# Patient Record
Sex: Female | Born: 1955 | Race: White | Hispanic: No | Smoking: Current every day smoker
Health system: Southern US, Community
[De-identification: ages and names within clinical notes are randomized; demographics above are authoritative.]

## PROBLEM LIST (undated history)

## (undated) DIAGNOSIS — F419 Anxiety disorder, unspecified: Secondary | ICD-10-CM

## (undated) DIAGNOSIS — K219 Gastro-esophageal reflux disease without esophagitis: Secondary | ICD-10-CM

## (undated) DIAGNOSIS — F29 Unspecified psychosis not due to a substance or known physiological condition: Secondary | ICD-10-CM

## (undated) DIAGNOSIS — F32A Depression, unspecified: Secondary | ICD-10-CM

## (undated) DIAGNOSIS — F329 Major depressive disorder, single episode, unspecified: Secondary | ICD-10-CM

---

## 2007-02-04 ENCOUNTER — Ambulatory Visit: Payer: Self-pay | Admitting: Internal Medicine

## 2007-02-18 ENCOUNTER — Ambulatory Visit: Payer: Self-pay | Admitting: Internal Medicine

## 2007-03-05 ENCOUNTER — Ambulatory Visit: Payer: Self-pay | Admitting: Internal Medicine

## 2007-03-05 LAB — CONVERTED CEMR LAB
Basophils Absolute: 0 10*3/uL (ref 0.0–0.1)
Basophils Relative: 0 % (ref 0–1)
Cholesterol: 205 mg/dL — ABNORMAL HIGH (ref 0–200)
Eosinophils Absolute: 0.1 10*3/uL — ABNORMAL LOW (ref 0.2–0.7)
Free T4: 1.08 ng/dL (ref 0.89–1.80)
HCT: 42.3 % (ref 36.0–46.0)
HDL: 49 mg/dL (ref >39)
Hemoglobin: 14 g/dL (ref 12.0–15.0)
Lymphocytes Relative: 35 % (ref 12–46)
Monocytes Absolute: 0.7 10*3/uL (ref 0.1–1.0)
Neutrophils Relative %: 55 % (ref 43–77)
Platelets: 347 10*3/uL (ref 150–400)
RBC: 4.41 M/uL (ref 3.87–5.11)
TSH: 1.478 microintl units/mL (ref 0.350–5.50)

## 2007-03-17 ENCOUNTER — Ambulatory Visit: Payer: Self-pay | Admitting: Internal Medicine

## 2007-03-24 ENCOUNTER — Ambulatory Visit: Payer: Self-pay | Admitting: Internal Medicine

## 2007-05-13 ENCOUNTER — Ambulatory Visit: Payer: Self-pay | Admitting: Internal Medicine

## 2007-05-28 ENCOUNTER — Ambulatory Visit: Payer: Self-pay | Admitting: Internal Medicine

## 2007-07-02 ENCOUNTER — Ambulatory Visit: Payer: Self-pay | Admitting: Internal Medicine

## 2007-07-09 ENCOUNTER — Emergency Department (HOSPITAL_COMMUNITY): Admission: EM | Admit: 2007-07-09 | Discharge: 2007-07-09 | Payer: Self-pay | Admitting: Emergency Medicine

## 2007-09-03 ENCOUNTER — Ambulatory Visit: Payer: Self-pay | Admitting: *Deleted

## 2007-10-31 ENCOUNTER — Emergency Department (HOSPITAL_COMMUNITY): Admission: EM | Admit: 2007-10-31 | Discharge: 2007-10-31 | Payer: Self-pay | Admitting: Emergency Medicine

## 2007-12-25 ENCOUNTER — Ambulatory Visit: Payer: Self-pay | Admitting: Internal Medicine

## 2008-03-04 ENCOUNTER — Ambulatory Visit: Payer: Self-pay | Admitting: Internal Medicine

## 2008-03-04 LAB — CONVERTED CEMR LAB
ALT: 13 units/L (ref 0–35)
Alkaline Phosphatase: 87 units/L (ref 39–117)
BUN: 25 mg/dL — ABNORMAL HIGH (ref 6–23)
Barbiturate Quant, Ur: NEGATIVE
Benzodiazepines.: NEGATIVE
Calcium: 10.2 mg/dL (ref 8.4–10.5)
Chloride: 103 meq/L (ref 96–112)
Creatinine, Ser: 0.75 mg/dL (ref 0.40–1.20)
Creatinine,U: 64.3 mg/dL
HCT: 38.9 % (ref 36.0–46.0)
Hemoglobin: 12.9 g/dL (ref 12.0–15.0)
Lymphocytes Relative: 25 % (ref 12–46)
Lymphs Abs: 3.1 10*3/uL (ref 0.7–4.0)
MCHC: 33.2 g/dL (ref 30.0–36.0)
MCV: 94.9 fL (ref 78.0–100.0)
Marijuana Metabolite: NEGATIVE
Neutro Abs: 8.2 10*3/uL — ABNORMAL HIGH (ref 1.7–7.7)
Opiate Screen, Urine: NEGATIVE
Phencyclidine (PCP): NEGATIVE
Platelets: 314 10*3/uL (ref 150–400)
Potassium: 4.1 meq/L (ref 3.5–5.3)
Propoxyphene: NEGATIVE
Sodium: 139 meq/L (ref 135–145)
Total Bilirubin: 0.2 mg/dL — ABNORMAL LOW (ref 0.3–1.2)

## 2008-03-10 ENCOUNTER — Ambulatory Visit (HOSPITAL_COMMUNITY): Admission: RE | Admit: 2008-03-10 | Discharge: 2008-03-10 | Payer: Self-pay | Admitting: Internal Medicine

## 2008-04-02 ENCOUNTER — Emergency Department (HOSPITAL_COMMUNITY): Admission: EM | Admit: 2008-04-02 | Discharge: 2008-04-02 | Payer: Self-pay | Admitting: Emergency Medicine

## 2008-04-06 ENCOUNTER — Ambulatory Visit: Payer: Self-pay | Admitting: Internal Medicine

## 2008-04-09 ENCOUNTER — Emergency Department (HOSPITAL_COMMUNITY): Admission: EM | Admit: 2008-04-09 | Discharge: 2008-04-09 | Payer: Self-pay | Admitting: Family Medicine

## 2008-06-11 ENCOUNTER — Emergency Department (HOSPITAL_COMMUNITY): Admission: EM | Admit: 2008-06-11 | Discharge: 2008-06-11 | Payer: Self-pay | Admitting: Emergency Medicine

## 2009-06-22 ENCOUNTER — Inpatient Hospital Stay (HOSPITAL_COMMUNITY): Admission: EM | Admit: 2009-06-22 | Discharge: 2009-06-30 | Payer: Self-pay | Admitting: Emergency Medicine

## 2009-06-25 ENCOUNTER — Ambulatory Visit: Payer: Self-pay | Admitting: Psychiatry

## 2009-07-25 ENCOUNTER — Encounter (HOSPITAL_BASED_OUTPATIENT_CLINIC_OR_DEPARTMENT_OTHER): Admission: RE | Admit: 2009-07-25 | Discharge: 2009-10-23 | Payer: Self-pay | Admitting: General Surgery

## 2009-08-11 IMAGING — CR DG CHEST 2V
2 series · 2 of 2 positions shown · non-contrast
Comparison: 07/09/2007

CLINICAL DATA: Cough, congestion, posterior chest pain, shortness
of breath, smoker

CHEST - 2 VIEW

[w chest lat]
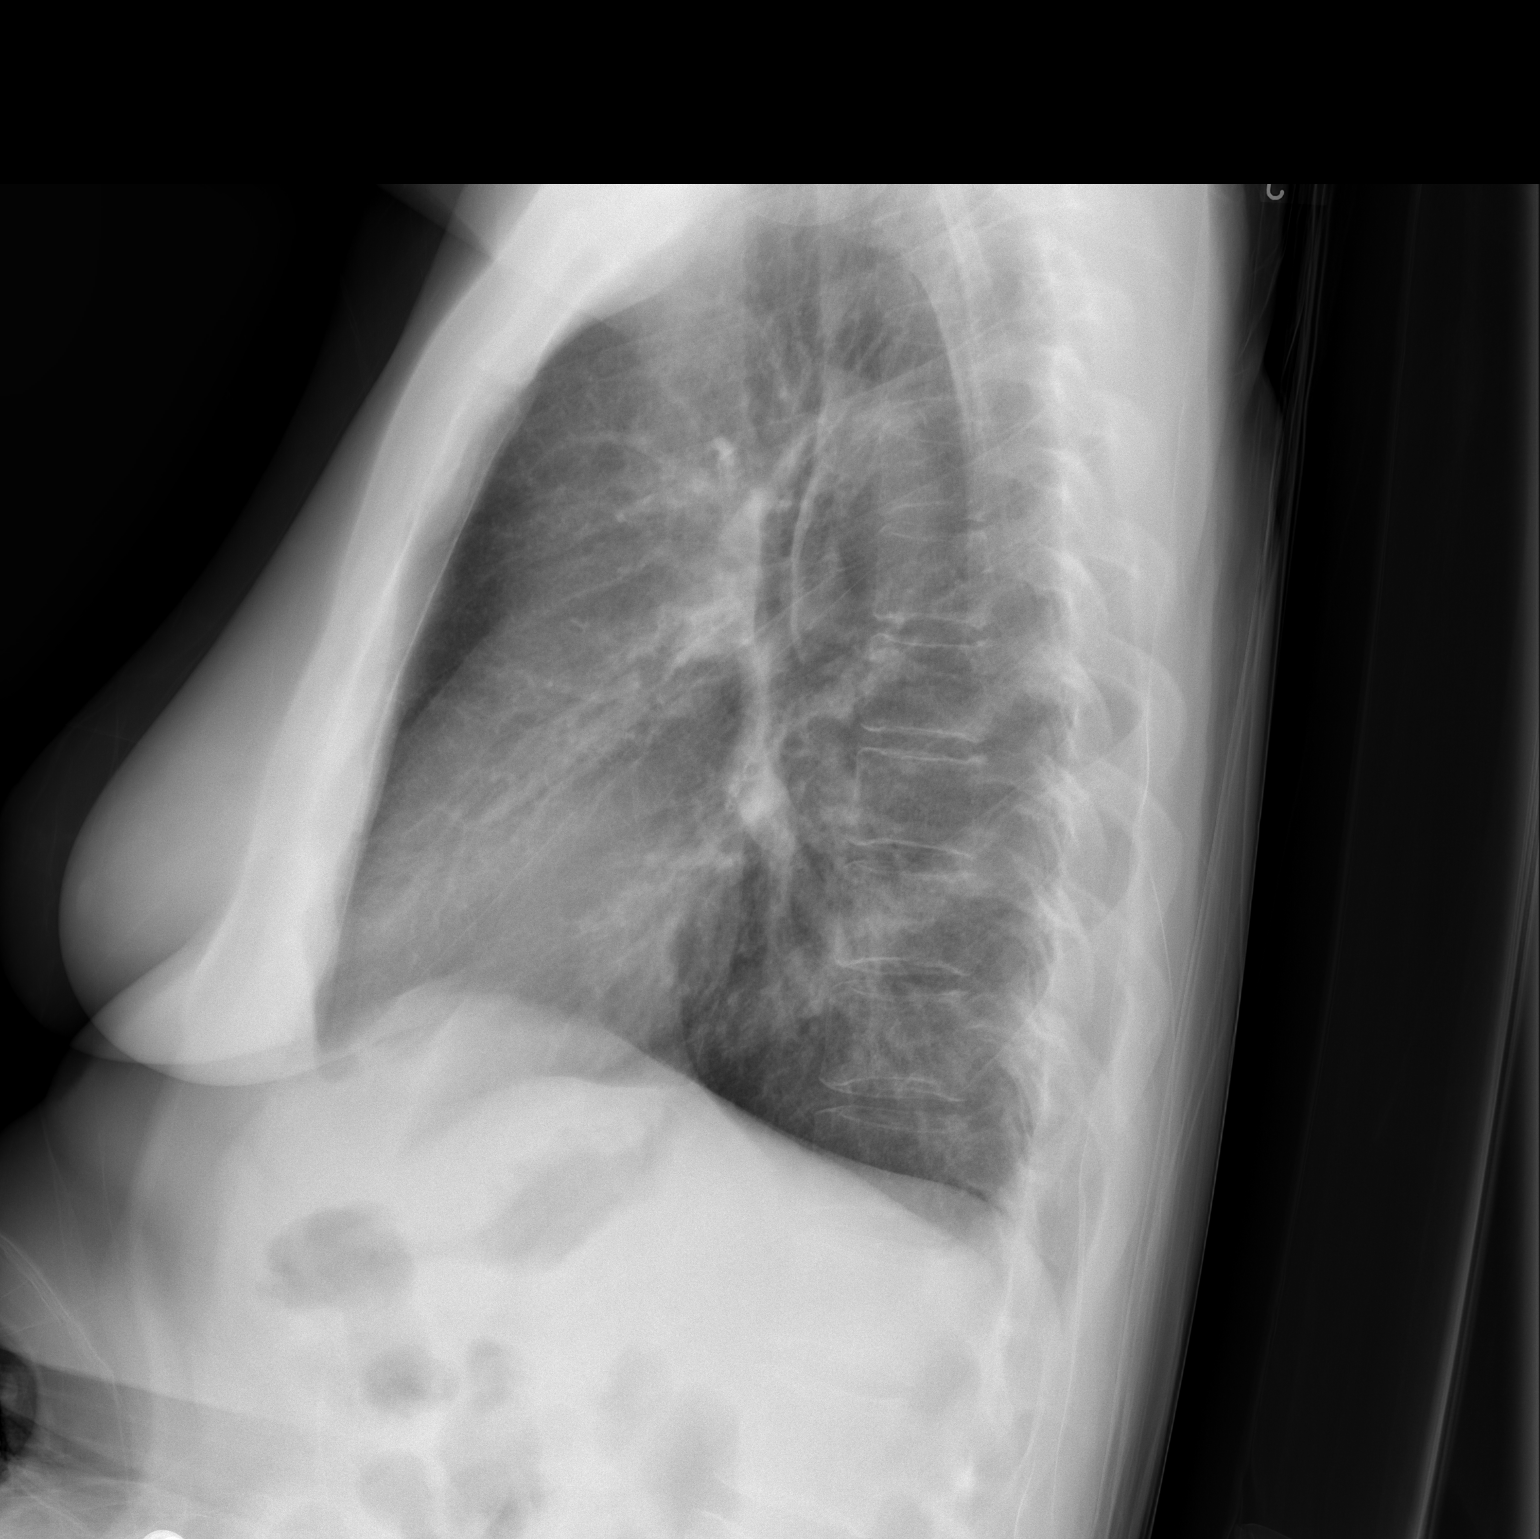

[w chest pa]
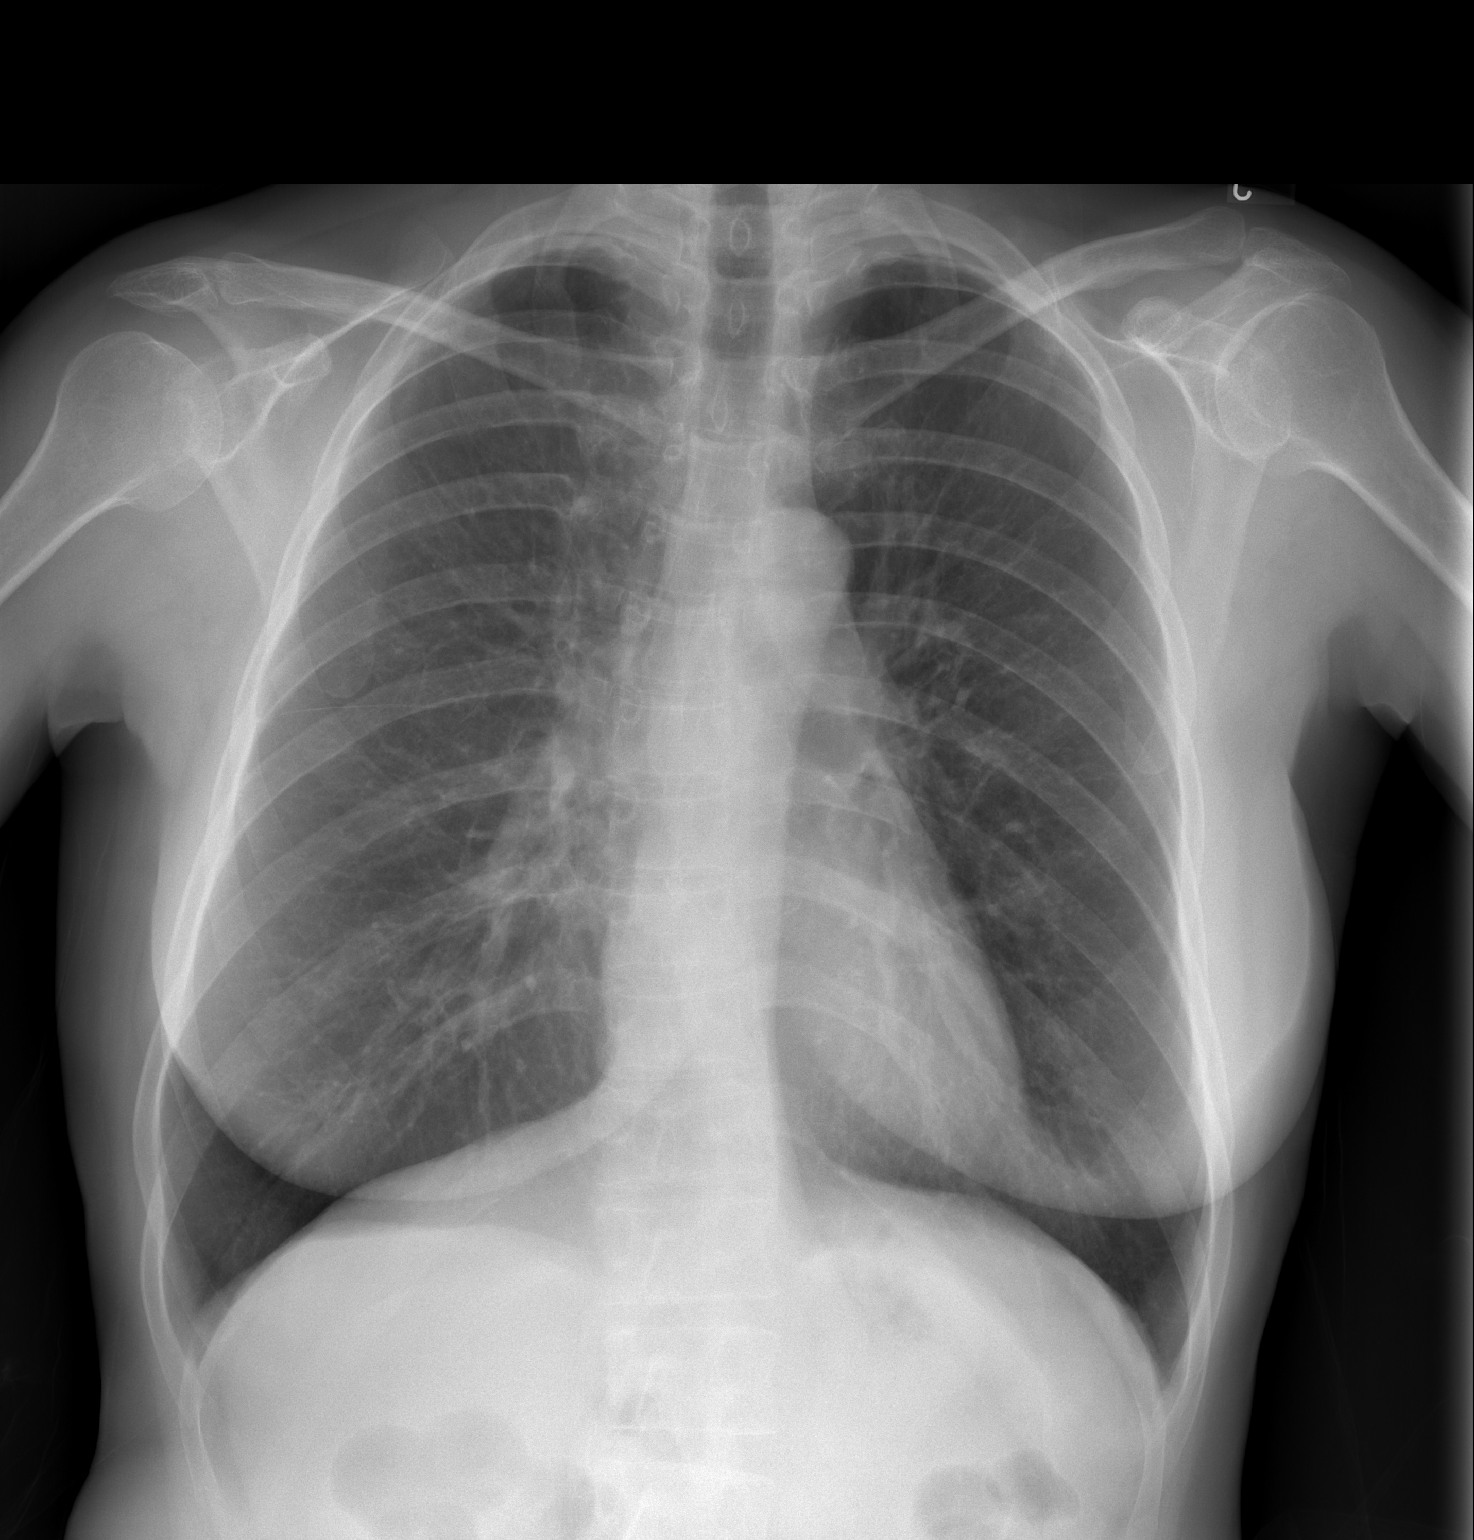

[2 of 2 positions shown; findings below may reference images not displayed]

FINDINGS: Normal heart size, mediastinal contours, and pulmonary vascularity.
Minimal peribronchial thickening and pulmonary hyperexpansion.
No pulmonary infiltrate, pleural effusion, or pneumothorax.
Question mild bony demineralization.
IMPRESSION: Minimal chronic peribronchial thickening and hyperinflation,
question asthma and COPD.
No acute abnormalities.

## 2010-06-07 LAB — TYPE AND SCREEN
ABO/RH(D): O POS
Antibody Screen: NEGATIVE

## 2010-06-07 LAB — DIFFERENTIAL
Basophils Absolute: 0.1 10*3/uL (ref 0.0–0.1)
Basophils Relative: 1 % (ref 0–1)
Eosinophils Absolute: 0 10*3/uL (ref 0.0–0.7)
Eosinophils Absolute: 0 10*3/uL (ref 0.0–0.7)
Eosinophils Relative: 1 % (ref 0–5)
Lymphocytes Relative: 30 % (ref 12–46)
Lymphs Abs: 1.4 10*3/uL (ref 0.7–4.0)
Lymphs Abs: 1.6 10*3/uL (ref 0.7–4.0)
Monocytes Absolute: 0.6 10*3/uL (ref 0.1–1.0)
Monocytes Relative: 11 % (ref 3–12)
Neutrophils Relative %: 57 % (ref 43–77)

## 2010-06-07 LAB — CBC
HCT: 20.9 % — ABNORMAL LOW (ref 36.0–46.0)
HCT: 23.6 % — ABNORMAL LOW (ref 36.0–46.0)
HCT: 25 % — ABNORMAL LOW (ref 36.0–46.0)
Hemoglobin: 7 g/dL — ABNORMAL LOW (ref 12.0–15.0)
Hemoglobin: 8 g/dL — ABNORMAL LOW (ref 12.0–15.0)
Hemoglobin: 8.4 g/dL — ABNORMAL LOW (ref 12.0–15.0)
Hemoglobin: 8.6 g/dL — ABNORMAL LOW (ref 12.0–15.0)
Hemoglobin: 9.9 g/dL — ABNORMAL LOW (ref 12.0–15.0)
MCHC: 33.3 g/dL (ref 30.0–36.0)
MCHC: 33.9 g/dL (ref 30.0–36.0)
MCV: 95.6 fL (ref 78.0–100.0)
MCV: 96.2 fL (ref 78.0–100.0)
MCV: 96.4 fL (ref 78.0–100.0)
MCV: 96.8 fL (ref 78.0–100.0)
MCV: 97.2 fL (ref 78.0–100.0)
Platelets: 407 10*3/uL — ABNORMAL HIGH (ref 150–400)
Platelets: 447 10*3/uL — ABNORMAL HIGH (ref 150–400)
RBC: 2.16 MIL/uL — ABNORMAL LOW (ref 3.87–5.11)
RBC: 2.38 MIL/uL — ABNORMAL LOW (ref 3.87–5.11)
RBC: 3.06 MIL/uL — ABNORMAL LOW (ref 3.87–5.11)
RDW: 14.6 % (ref 11.5–15.5)
RDW: 15.1 % (ref 11.5–15.5)
RDW: 15.8 % — ABNORMAL HIGH (ref 11.5–15.5)
WBC: 4.4 10*3/uL (ref 4.0–10.5)
WBC: 6.3 10*3/uL (ref 4.0–10.5)

## 2010-06-07 LAB — URINALYSIS, ROUTINE W REFLEX MICROSCOPIC
Glucose, UA: NEGATIVE mg/dL
Hgb urine dipstick: NEGATIVE
Protein, ur: NEGATIVE mg/dL
pH: 7 (ref 5.0–8.0)

## 2010-06-07 LAB — POCT I-STAT, CHEM 8
Calcium, Ion: 1.02 mmol/L — ABNORMAL LOW (ref 1.12–1.32)
HCT: 25 % — ABNORMAL LOW (ref 36.0–46.0)
TCO2: 23 mmol/L (ref 0–100)

## 2010-06-07 LAB — CULTURE, BLOOD (ROUTINE X 2): Culture: NO GROWTH

## 2010-06-07 LAB — RETICULOCYTES
RBC.: 2.53 MIL/uL — ABNORMAL LOW (ref 3.87–5.11)
Retic Count, Absolute: 83.5 10*3/uL (ref 19.0–186.0)

## 2010-06-07 LAB — COMPREHENSIVE METABOLIC PANEL
ALT: 17 U/L (ref 0–35)
AST: 22 U/L (ref 0–37)
AST: 30 U/L (ref 0–37)
Albumin: 2.3 g/dL — ABNORMAL LOW (ref 3.5–5.2)
BUN: 6 mg/dL (ref 6–23)
Calcium: 8 mg/dL — ABNORMAL LOW (ref 8.4–10.5)
Calcium: 8 mg/dL — ABNORMAL LOW (ref 8.4–10.5)
Chloride: 105 mEq/L (ref 96–112)
Creatinine, Ser: 0.72 mg/dL (ref 0.4–1.2)
Creatinine, Ser: 0.84 mg/dL (ref 0.4–1.2)
GFR calc Af Amer: >60 mL/min (ref >60)
Glucose, Bld: 126 mg/dL — ABNORMAL HIGH (ref 70–99)
Sodium: 140 mEq/L (ref 135–145)
Total Protein: 5.4 g/dL — ABNORMAL LOW (ref 6.0–8.3)
Total Protein: 6.3 g/dL (ref 6.0–8.3)

## 2010-06-07 LAB — ACETAMINOPHEN LEVEL: Acetaminophen (Tylenol), Serum: <10 ug/mL — ABNORMAL LOW (ref 10–30)

## 2010-06-07 LAB — OVA AND PARASITE EXAMINATION
Ova and parasites: NONE SEEN
Ova and parasites: NONE SEEN

## 2010-06-07 LAB — BASIC METABOLIC PANEL
BUN: 8 mg/dL (ref 6–23)
CO2: 25 mEq/L (ref 19–32)
Chloride: 104 mEq/L (ref 96–112)
GFR calc non Af Amer: >60 mL/min (ref >60)
Glucose, Bld: 114 mg/dL — ABNORMAL HIGH (ref 70–99)
Potassium: 4 mEq/L (ref 3.5–5.1)
Sodium: 137 mEq/L (ref 135–145)

## 2010-06-07 LAB — PHOSPHORUS: Phosphorus: 2.5 mg/dL (ref 2.3–4.6)

## 2010-06-07 LAB — FERRITIN: Ferritin: 42 ng/mL (ref 10–291)

## 2010-06-07 LAB — PROTIME-INR
INR: 1.18 (ref 0.00–1.49)
Prothrombin Time: 14.9 seconds (ref 11.6–15.2)
Prothrombin Time: 15.2 seconds (ref 11.6–15.2)

## 2010-06-07 LAB — TSH: TSH: 1.505 u[IU]/mL (ref 0.350–4.500)

## 2010-06-07 LAB — IRON AND TIBC: UIBC: 206 ug/dL

## 2010-06-07 LAB — RAPID URINE DRUG SCREEN, HOSP PERFORMED
Barbiturates: NOT DETECTED
Benzodiazepines: POSITIVE — AB

## 2010-06-07 LAB — SALICYLATE LEVEL: Salicylate Lvl: 4.2 mg/dL (ref 2.8–20.0)

## 2010-06-07 LAB — FOLATE: Folate: 19.6 ng/mL

## 2010-12-15 LAB — DIFFERENTIAL
Eosinophils Absolute: 0.2
Lymphocytes Relative: 42
Lymphs Abs: 2.7
Monocytes Relative: 8
Neutro Abs: 3
Neutrophils Relative %: 47

## 2010-12-15 LAB — URINALYSIS, ROUTINE W REFLEX MICROSCOPIC
Bilirubin Urine: NEGATIVE
Hgb urine dipstick: NEGATIVE
Ketones, ur: NEGATIVE
Nitrite: NEGATIVE
Specific Gravity, Urine: 1.021
Urobilinogen, UA: 0.2
pH: 6

## 2010-12-15 LAB — CBC
Hemoglobin: 13.5
MCHC: 33.9
MCV: 96
RBC: 4.16
RDW: 13.6

## 2010-12-15 LAB — GC/CHLAMYDIA PROBE AMP, GENITAL: GC Probe Amp, Genital: NEGATIVE

## 2010-12-15 LAB — COMPREHENSIVE METABOLIC PANEL
BUN: 14
CO2: 25
Calcium: 10.1
Creatinine, Ser: 0.75
GFR calc non Af Amer: >60
Glucose, Bld: 111 — ABNORMAL HIGH
Sodium: 138
Total Protein: 6.7

## 2010-12-15 LAB — WET PREP, GENITAL
Clue Cells Wet Prep HPF POC: NONE SEEN
Trich, Wet Prep: NONE SEEN
WBC, Wet Prep HPF POC: NONE SEEN
Yeast Wet Prep HPF POC: NONE SEEN

## 2010-12-15 LAB — LIPASE, BLOOD: Lipase: 19

## 2016-03-14 ENCOUNTER — Encounter (HOSPITAL_COMMUNITY): Payer: Self-pay | Admitting: Emergency Medicine

## 2016-03-14 ENCOUNTER — Emergency Department (HOSPITAL_COMMUNITY)
Admission: EM | Admit: 2016-03-14 | Discharge: 2016-03-15 | Disposition: A | Discharge status: Other Acute Inpatient Hospital | Payer: Self-pay | Attending: Emergency Medicine | Admitting: Emergency Medicine

## 2016-03-14 ENCOUNTER — Emergency Department (HOSPITAL_COMMUNITY): Payer: Self-pay

## 2016-03-14 DIAGNOSIS — Z5181 Encounter for therapeutic drug level monitoring: Secondary | ICD-10-CM | POA: Insufficient documentation

## 2016-03-14 DIAGNOSIS — F29 Unspecified psychosis not due to a substance or known physiological condition: Secondary | ICD-10-CM | POA: Insufficient documentation

## 2016-03-14 DIAGNOSIS — F17210 Nicotine dependence, cigarettes, uncomplicated: Secondary | ICD-10-CM | POA: Insufficient documentation

## 2016-03-14 HISTORY — DX: Unspecified psychosis not due to a substance or known physiological condition: F29

## 2016-03-14 HISTORY — DX: Depression, unspecified: F32.A

## 2016-03-14 HISTORY — DX: Major depressive disorder, single episode, unspecified: F32.9

## 2016-03-14 HISTORY — DX: Anxiety disorder, unspecified: F41.9

## 2016-03-14 HISTORY — DX: Gastro-esophageal reflux disease without esophagitis: K21.9

## 2016-03-14 LAB — CBC WITH DIFFERENTIAL/PLATELET
Basophils Absolute: 0 10*3/uL (ref 0.0–0.1)
Basophils Relative: 0 %
EOS ABS: 0.1 10*3/uL (ref 0.0–0.7)
Eosinophils Relative: 1 %
HEMATOCRIT: 44 % (ref 36.0–46.0)
HEMOGLOBIN: 15 g/dL (ref 12.0–15.0)
LYMPHS ABS: 1.7 10*3/uL (ref 0.7–4.0)
LYMPHS PCT: 15 %
MCH: 32.4 pg (ref 26.0–34.0)
MCHC: 34.1 g/dL (ref 30.0–36.0)
MCV: 95 fL (ref 78.0–100.0)
MONOS PCT: 6 %
Monocytes Absolute: 0.7 10*3/uL (ref 0.1–1.0)
NEUTROS ABS: 8.5 10*3/uL — AB (ref 1.7–7.7)
NEUTROS PCT: 78 %
Platelets: 327 10*3/uL (ref 150–400)
RBC: 4.63 MIL/uL (ref 3.87–5.11)
RDW: 12 % (ref 11.5–15.5)
WBC: 10.9 10*3/uL — ABNORMAL HIGH (ref 4.0–10.5)

## 2016-03-14 LAB — COMPREHENSIVE METABOLIC PANEL
ALBUMIN: 4.4 g/dL (ref 3.5–5.0)
ALK PHOS: 101 U/L (ref 38–126)
ALT: 13 U/L — ABNORMAL LOW (ref 14–54)
ANION GAP: 8 (ref 5–15)
AST: 22 U/L (ref 15–41)
BUN: 7 mg/dL (ref 6–20)
CALCIUM: 9 mg/dL (ref 8.9–10.3)
CHLORIDE: 101 mmol/L (ref 101–111)
CO2: 23 mmol/L (ref 22–32)
Creatinine, Ser: 0.68 mg/dL (ref 0.44–1.00)
GFR calc Af Amer: >60 mL/min (ref >60)
GFR calc non Af Amer: >60 mL/min (ref >60)
GLUCOSE: 125 mg/dL — AB (ref 65–99)
Potassium: 3.5 mmol/L (ref 3.5–5.1)
SODIUM: 132 mmol/L — AB (ref 135–145)
Total Bilirubin: 0.8 mg/dL (ref 0.3–1.2)
Total Protein: 7.7 g/dL (ref 6.5–8.1)

## 2016-03-14 LAB — RAPID URINE DRUG SCREEN, HOSP PERFORMED
AMPHETAMINES: NOT DETECTED
BARBITURATES: NOT DETECTED
Benzodiazepines: NOT DETECTED
Cocaine: NOT DETECTED
Opiates: NOT DETECTED
TETRAHYDROCANNABINOL: POSITIVE — AB

## 2016-03-14 LAB — URINALYSIS, ROUTINE W REFLEX MICROSCOPIC
BILIRUBIN URINE: NEGATIVE
Bacteria, UA: NONE SEEN
Glucose, UA: NEGATIVE mg/dL
Ketones, ur: NEGATIVE mg/dL
LEUKOCYTES UA: NEGATIVE
NITRITE: NEGATIVE
PH: 5 (ref 5.0–8.0)
Protein, ur: NEGATIVE mg/dL
SPECIFIC GRAVITY, URINE: 1.012 (ref 1.005–1.030)

## 2016-03-14 LAB — ETHANOL: Alcohol, Ethyl (B): <5 mg/dL (ref <5)

## 2016-03-14 MED ORDER — ALUM & MAG HYDROXIDE-SIMETH 200-200-20 MG/5ML PO SUSP: 30.0000 mL | ORAL | Status: DC | PRN

## 2016-03-14 MED ORDER — ACETAMINOPHEN 325 MG PO TABS: 650.0000 mg | ORAL_TABLET | ORAL | Status: DC | PRN

## 2016-03-14 MED ORDER — NICOTINE 21 MG/24HR TD PT24
21.0000 mg | MEDICATED_PATCH | Freq: Every day | TRANSDERMAL | Status: DC
  Administered 2016-03-14: 21 mg via TRANSDERMAL
  Filled 2016-03-14 (×2): qty 1
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
## 2016-03-14 NOTE — ED Notes (Signed)
Pt allowed to use phone at this time  
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
## 2016-03-14 NOTE — ED Notes (Signed)
Pt given copy of IVC paperwork. Pt says she has a court case pending on Friday the 29th that she must be at or she will lose her home. Pt asked if she informed [facility] of the same & she stated she did.

## 2016-03-14 NOTE — [facility] Counselor (Signed)
Spoke with Ron, RN who reports the pt is going to have a CT scan in order to rule out any medical concerns [facility]. Bunnie PionAC Tori, RN reviewing.   Princess BruinsAquicha Duff, MSW, Theresia MajorsLCSWA
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
## 2016-03-14 NOTE — BH Assessment (Signed)
Bed assignment [facility] been [facility] Tori, RN and [facility] TTS to seek placement. Ron, RN has been contacted and a copy of the IVC paperwork has been requested in order to seek placement for the pt.   Princess BruinsAquicha Duff, MSW, Theresia MajorsLCSWA

## 2016-03-14 NOTE — ED Notes (Signed)
Pt requested books to read. Agricultural engineerBrenda(safety sitter) was able to locate some books for her. Pt remains calm and cooperative.

## 2016-03-14 NOTE — ED Notes (Signed)
Pt ambulated to restroom. Pt requested crackers & peanut butter. Provided.  Returned to room w/ sitter present.

## 2016-03-14 NOTE — Clinical Social Work Note (Signed)
Pt has been declined by the following facilities:  [facility], [facility], [facility]. Inpt admission is still being researched.   Princess BruinsAquicha Duff, MSW, Theresia MajorsLCSWA

## 2016-03-14 NOTE — Progress Notes (Signed)
Per Jacki ConesLaurie, NP meets inpatient criteria. Pt. [facility] pending medical clearance. Patient will need to have neurological looked at . Patient has no hx of psych treatment.  Orry Sigl K. Sherlon HandingHarris, LCAS-A, LPC-A, [facility]  Counselor 03/14/2016 6:25 PM

## 2016-03-14 NOTE — ED Notes (Signed)
IVC paperwork faxed to [facility] 

## 2016-03-14 NOTE — ED Notes (Signed)
Pt given a coke to drink.

## 2016-03-14 NOTE — ED Notes (Signed)
Pt refusing CT scan stating its "bull shit". Pt asking to see MD. Dr Richrd PrimeMcMannus made aware of the pt wanting to see her. EDP went to room to explain reason for testing. Pt again told MD that she thougt the test was "bull shit".

## 2016-03-14 NOTE — BH Assessment (Signed)
Pt has been referred to the following inpt facilities: [facility], [facility], [facility], [facility], [facility] [address] [place] [facility] [address] [facility], [facility], [place], [place] [facility], [place], [place], [place]  Princess BruinsAquicha Duff, MSW, [employer]CSWA

## 2016-03-15 ENCOUNTER — Encounter (HOSPITAL_COMMUNITY): Payer: Self-pay | Admitting: *Deleted

## 2016-03-15 ENCOUNTER — Inpatient Hospital Stay (HOSPITAL_COMMUNITY)
Admission: AD | Admit: 2016-03-15 | Discharge: 2016-03-17 | DRG: 885 | Disposition: A | Discharge status: Home / Self Care | Payer: No Typology Code available for payment source | Attending: Psychiatry | Admitting: Psychiatry

## 2016-03-15 DIAGNOSIS — F17210 Nicotine dependence, cigarettes, uncomplicated: Secondary | ICD-10-CM | POA: Diagnosis present

## 2016-03-15 DIAGNOSIS — F22 Delusional disorders: Principal | ICD-10-CM | POA: Diagnosis present

## 2016-03-15 DIAGNOSIS — F29 Unspecified psychosis not due to a substance or known physiological condition: Secondary | ICD-10-CM | POA: Diagnosis present

## 2016-03-15 DIAGNOSIS — Z79899 Other long term (current) drug therapy: Secondary | ICD-10-CM | POA: Diagnosis not present

## 2016-03-15 MED ORDER — OLANZAPINE 5 MG PO TBDP
5.0000 mg | ORAL_TABLET | Freq: Once | ORAL | Status: DC
  Filled 2016-03-15: qty 1

## 2016-03-15 MED ORDER — OLANZAPINE 10 MG PO TBDP: 10.0000 mg | ORAL_TABLET | Freq: Three times a day (TID) | ORAL | Status: DC | PRN

## 2016-03-15 MED ORDER — OLANZAPINE 5 MG PO TBDP
5.0000 mg | ORAL_TABLET | Freq: Three times a day (TID) | ORAL | Status: DC
  Filled 2016-03-15 (×5): qty 1

## 2016-03-15 MED ORDER — HYDROXYZINE HCL 25 MG PO TABS
25.0000 mg | ORAL_TABLET | Freq: Four times a day (QID) | ORAL | Status: DC | PRN
  Filled 2016-03-15: qty 10

## 2016-03-15 MED ORDER — TRAZODONE HCL 50 MG PO TABS
50.0000 mg | ORAL_TABLET | Freq: Every evening | ORAL | Status: DC | PRN
  Filled 2016-03-15: qty 7

## 2016-03-15 MED ORDER — ALUM & MAG HYDROXIDE-SIMETH 200-200-20 MG/5ML PO SUSP
30.0000 mL | ORAL | Status: DC | PRN
  Administered 2016-03-16 (×3): 30 mL via ORAL
  Filled 2016-03-15 (×3): qty 30

## 2016-03-15 MED ORDER — MAGNESIUM HYDROXIDE 400 MG/5ML PO SUSP: 30.0000 mL | Freq: Every day | ORAL | Status: DC | PRN

## 2016-03-15 MED ORDER — NICOTINE 21 MG/24HR TD PT24
21.0000 mg | MEDICATED_PATCH | Freq: Every day | TRANSDERMAL | Status: DC
  Filled 2016-03-15 (×3): qty 1

## 2016-03-15 MED ORDER — ZIPRASIDONE MESYLATE 20 MG IM SOLR: 20.0000 mg | INTRAMUSCULAR | Status: DC | PRN

## 2016-03-15 MED ORDER — NICOTINE POLACRILEX 2 MG MT GUM
2.0000 mg | CHEWING_GUM | OROMUCOSAL | Status: DC | PRN
  Administered 2016-03-16 – 2016-03-17 (×3): 2 mg via ORAL
  Filled 2016-03-15 (×3): qty 1

## 2016-03-15 MED ORDER — ACETAMINOPHEN 325 MG PO TABS: 650.0000 mg | ORAL_TABLET | Freq: Four times a day (QID) | ORAL | Status: DC | PRN

## 2016-03-15 MED ORDER — OLANZAPINE 10 MG IM SOLR: 10.0000 mg | Freq: Once | INTRAMUSCULAR | Status: DC | PRN

## 2016-03-15 MED ORDER — LORAZEPAM 1 MG PO TABS: 1.0000 mg | ORAL_TABLET | ORAL | Status: DC | PRN

## 2016-03-15 NOTE — ED Notes (Signed)
[facility] notified that pt is en route to facility via sheriff dept.

## 2016-03-15 NOTE — ED Notes (Signed)
Pt ambulated to bathroom 

## 2016-03-15 NOTE — ED Notes (Signed)
Pt given meal.   Pt has copy of IVC paper in room given by night shift nurse and is fixated that there is no "file number" on her paperwork and that the paperwork is void because of it.  Pt explained that she is still Involuntarily committed and cannot leave.   Pt is also saying that she has an eviction court date tomorrow and is worried because she will not be able to make the appearance.  Pt wanting proof that she will be exempt from attending due to her involuntary commitment. She is also worried because her cats are outside and unable to get into the house.   MD notified of pt concerns.

## 2016-03-15 NOTE — ED Notes (Addendum)
Pt refusing EKG. Dr. Adriana Simasook notified.  Claire [name], [facility] notified and says that we need EKG.  Pt explained that it was for a new medication.  PT states that she does not want any new medication.

## 2016-03-15 NOTE — ED Notes (Addendum)
Pt given meal tray.  Pt also requesting to see phones to get phone number to neighbors house so they can take care of her cats who are locked outside.   Security at bedside with belongings from safe and pt, with observation of nurse and 2 security guards, opened up phones to retrieve phone numbers. While looking through phone I notified pt that she could only have 2, 5 min phone calls per day, she then told me "take a few steps back and shut the fuck up."  Security and nurse both explained to pt that she would not speak to staff in that manner.  Pt soon apologized.   Belongings have been re-inventoried by security and placed back into safe.   Pt now taking a shower with supervision of security and female sitter.  Pt given new gown, new socks, toothbrush/paste, soap, washcloths, and towels.

## 2016-03-15 NOTE — ED Notes (Signed)
Meal given to pt.

## 2016-03-15 NOTE — ED Notes (Signed)
Pt requested phone number for [place] [facility].  Pt given number and is on phone at this time.

## 2016-03-15 NOTE — ED Notes (Signed)
Security at bedside delivering valuables to [employer]uty sheriff.

## 2016-03-15 NOTE — ED Notes (Addendum)
Pt called charge RN to room to discuss the fact she is supposed to appeal an eviction notice tomorrow and is concerned because she is under IVC and is being [facility].  Aliene Al[place]Pat Simpson [place] said that if patient can't get to the court's office tomorrow, she will need to contact an attorney.  If pt or her attorney signs the paperwork, a friend or family member can take it to the court on her behalf.  Notified pt.
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
## 2016-03-15 NOTE — Tx Team (Signed)
Initial Treatment Plan 03/15/2016 10:14 PM Claire [name] [id number]    PATIENT STRESSORS: Legal issue Loss of cats Traumatic event   PATIENT STRENGTHS: Ability for insight Average or above average intelligence Capable of independent living Communication skills Motivation for treatment/growth Physical Health   PATIENT IDENTIFIED PROBLEMS: Paranoia  "Save my house"  "Save my cat family"                 DISCHARGE CRITERIA:  Ability to meet basic life and health needs Improved stabilization in mood, thinking, and/or behavior Motivation to continue treatment in a less acute level of care Need for constant or close observation no longer present  PRELIMINARY DISCHARGE PLAN: Return to previous living arrangement  PATIENT/FAMILY INVOLVEMENT: This treatment plan has been presented to and reviewed with the patient, Claire [name].  The patient and family have been given the opportunity to ask questions and make suggestions.  Carleene OverlieMiddleton, Kenn Rekowski P, RN 03/15/2016, 10:14 PM

## 2016-03-15 NOTE — BHH Counselor (Signed)
[facility] [place]. Pt will be going to room 505-1.

## 2016-03-15 NOTE — ED Notes (Signed)
Pt requesting vegetarian meal for lunch, Diplomatic Services operational officersecretary notified.

## 2016-03-15 NOTE — ED Notes (Signed)
MidwifeDeputy sheriff given packet of pt information to give to [facility] including: EMTALA, facesheet, carelink transfer, med necessity, copies of IVC paperwork, e-signature.    As patient left, she stated "Claire [name], you're a brilliant snot".  Pt walked out with Midwifedeputy sheriff. Alert and oriented, in no acute distress.
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
## 2016-03-15 NOTE — ED Notes (Signed)
Pt ambulated to restroom & returned to room w/ no complications. 
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
## 2016-03-16 ENCOUNTER — Encounter (HOSPITAL_COMMUNITY): Payer: Self-pay | Admitting: Psychiatry

## 2016-03-16 DIAGNOSIS — Z79899 Other long term (current) drug therapy: Secondary | ICD-10-CM

## 2016-03-16 DIAGNOSIS — F22 Delusional disorders: Principal | ICD-10-CM

## 2016-03-16 NOTE — Progress Notes (Signed)
Social worker assisting patient to retrieve document from email account. All interdiscplinary team members are working together to assist patient with retaining documents and notarizing, so she can avoid being evicted from home.

## 2016-03-16 NOTE — Progress Notes (Signed)
Patient A/O, no note distress. Patient was encourage to wait until Tuesday to complete court paperwork. She agreed. Urine specimen collected. Throughout the shift patient has been cooperative, she participated in groups. Patient no issues with sleeping. She denies feeling depressed. Staff will continue to assist patient and maintain safety.

## 2016-03-16 NOTE — BHH Group Notes (Signed)
BHH LCSW Group Therapy  03/16/2016 1:46 PM  Type of Therapy:  Group Therapy  Participation Level:  Did Not Attend  Modes of Intervention:  Activity, Discussion, Education, Socialization and Support  Summary of Progress/Problems: Chaplin facilitated group. Patients discussed care and what care means to them.   [name] [name] MSW, LCSWA  03/16/2016, 1:46 PM  

## 2016-03-16 NOTE — BHH Counselor (Signed)
Pt is currently refusing aftercare.   Daisy FloroCandace L Jatavion Peaster MSW, LCSWA  03/16/2016 4:46 PM

## 2016-03-16 NOTE — Progress Notes (Signed)
Notified AC of patient needing documentation notarize, she noted "I am ready to notarize documents when family bring them to facility."
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
## 2016-03-16 NOTE — Progress Notes (Signed)
Writer has observed patient up in the dayroom with minimal interaction with peers. She reports to Clinical research associatewriter during our 1:1 conversation that she was glad that her paperwork was taken care of so that she won't be evicted out of her apartment. 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 She attended group this evening and reports that since being here the staff have been very helpful. Support and encouragement given, safety maintained on unit with 15 min checks.

## 2016-03-16 NOTE — Progress Notes (Signed)
Patient with family member filling out documents.

## 2016-03-16 NOTE — Tx Team (Signed)
Interdisciplinary Treatment and Diagnostic Plan Update  03/16/2016 Time of Session: 9:00am Claire [name] Delia MRN: [id number]  Principal Diagnosis: Delusional disorder, persecutory type [name](HCC)  Secondary Diagnoses: Principal Problem:   Delusional disorder, persecutory type (HCC)   Current Medications:  Current Facility-Administered Medications  Medication Dose Route Frequency Provider Last Rate Last Dose  . acetaminophen (TYLENOL) tablet 650 mg  650 mg Oral Q6H PRN Laveda AbbeLaurie Britton Parks, NP      . alum & mag hydroxide-simeth (MAALOX/MYLANTA) 200-200-20 MG/5ML suspension 30 mL  30 mL Oral Q4H PRN Laveda AbbeLaurie Britton Parks, NP   30 mL at 03/16/16 0504  . hydrOXYzine (ATARAX/VISTARIL) tablet 25 mg  25 mg Oral Q6H PRN Laveda AbbeLaurie Britton Parks, NP      . magnesium hydroxide (MILK OF MAGNESIA) suspension 30 mL  30 mL Oral Daily PRN Laveda AbbeLaurie Britton Parks, NP      . nicotine (NICODERM CQ - dosed in mg/24 hours) patch 21 mg  21 mg Transdermal Daily Laveda AbbeLaurie Britton Parks, NP      . nicotine polacrilex (NICORETTE) gum 2 mg  2 mg Oral PRN Jackelyn PolingJason A Berry, NP      . traZODone (DESYREL) tablet 50 mg  50 mg Oral QHS PRN Laveda AbbeLaurie Britton Parks, NP       PTA Medications: Prescriptions Prior to Admission  Medication Sig Dispense Refill Last Dose  . aspirin 325 MG tablet Take 650-975 mg by mouth every 4 (four) hours as needed for mild pain or moderate pain.   Past Week at Unknown time    Patient Stressors: Legal issue Loss of cats Traumatic event  Patient Strengths: Ability for insight Average or above average intelligence Capable of independent living Communication skills Motivation for treatment/growth Physical Health  Treatment Modalities: Medication Management, Group therapy, Case management,  1 to 1 session with clinician, Psychoeducation, Recreational therapy.   Physician Treatment Plan for Primary Diagnosis: Delusional disorder, persecutory type (HCC) Long Term Goal(s): Improvement in symptoms so as  ready for discharge Improvement in symptoms so as ready for discharge   Short Term Goals: Ability to identify changes in lifestyle to reduce recurrence of condition will improve Ability to verbalize feelings will improve Ability to demonstrate self-control will improve Ability to identify changes in lifestyle to reduce recurrence of condition will improve Ability to verbalize feelings will improve Ability to maintain clinical measurements within normal limits will improve  Medication Management: Evaluate patient's response, side effects, and tolerance of medication regimen.  Therapeutic Interventions: 1 to 1 sessions, Unit Group sessions and Medication administration.  Evaluation of Outcomes: Progressing  Physician Treatment Plan for Secondary Diagnosis: Principal Problem:   Delusional disorder, persecutory type (HCC)  Long Term Goal(s): Improvement in symptoms so as ready for discharge Improvement in symptoms so as ready for discharge   Short Term Goals: Ability to identify changes in lifestyle to reduce recurrence of condition will improve Ability to verbalize feelings will improve Ability to demonstrate self-control will improve Ability to identify changes in lifestyle to reduce recurrence of condition will improve Ability to verbalize feelings will improve Ability to maintain clinical measurements within normal limits will improve     Medication Management: Evaluate patient's response, side effects, and tolerance of medication regimen.  Therapeutic Interventions: 1 to 1 sessions, Unit Group sessions and Medication administration.  Evaluation of Outcomes: Progressing   RN Treatment Plan for Primary Diagnosis: Delusional disorder, persecutory type (HCC) Long Term Goal(s): Knowledge of disease and therapeutic regimen to maintain health will improve  Short Term Goals: Ability  to verbalize frustration and anger appropriately will improve, Ability to demonstrate self-control,  Ability to participate in decision making will improve, Ability to identify and develop effective coping behaviors will improve and Compliance with prescribed medications will improve  Medication Management: RN will administer medications as ordered by provider, will assess and evaluate patient's response and provide education to patient for prescribed medication. RN will report any adverse and/or side effects to prescribing provider.  Therapeutic Interventions: 1 on 1 counseling sessions, Psychoeducation, Medication administration, Evaluate responses to treatment, Monitor vital signs and CBGs as ordered, Perform/monitor CIWA, COWS, AIMS and Fall Risk screenings as ordered, Perform wound care treatments as ordered.  Evaluation of Outcomes: Progressing   LCSW Treatment Plan for Primary Diagnosis: Delusional disorder, persecutory type (HCC) Long Term Goal(s): Safe transition to appropriate next level of care at discharge, Engage patient in therapeutic group addressing interpersonal concerns.  Short Term Goals: Engage patient in aftercare planning with referrals and resources, Increase social support, Increase ability to appropriately verbalize feelings, Increase emotional regulation and Increase skills for wellness and recovery  Therapeutic Interventions: Assess for all discharge needs, 1 to 1 time with Social worker, Explore available resources and support systems, Assess for adequacy in community support network, Educate family and significant other(s) on suicide prevention, Complete Psychosocial Assessment, Interpersonal group therapy.  Evaluation of Outcomes: Progressing   Progress in Treatment: Attending groups: Yes. Participating in groups: Yes. Taking medication as prescribed: No Toleration medication: No. and As evidenced by:  Pt not taking meds.  Family/Significant other contact made: No, will contact:  pt refused  Patient understands diagnosis: No. and As evidenced by:  Limited  insight  Discussing patient identified problems/goals with staff: Yes. Medical problems stabilized or resolved: Yes. Denies suicidal/homicidal ideation: Yes. Issues/concerns per patient self-inventory: No. Other: NA  New problem(s) identified: No, Describe:  NA  New Short Term/Long Term Goal(s):  Discharge Plan or Barriers: Pt plans to return home and follow up with outpatient.    Reason for Continuation of Hospitalization: Delusions  Medication stabilization   Estimated Length of Stay: 3-5 days   Attendees: Patient: 03/16/2016 1:32 PM  Physician: Jomarie LongsSaramma Eappen, MD  03/16/2016 1:32 PM  Nursing: Jari SportsmanBeulah, RN  03/16/2016 1:32 PM  RN Care Manager: Victorino DikeJennifer, RN  03/16/2016 1:32 PM  Social Worker: Rondall Allegraandace L Amadu Schlageter, LCSWA 03/16/2016 1:32 PM  Recreational Therapist:  03/16/2016 1:32 PM  Other:  03/16/2016 1:32 PM  Other:  03/16/2016 1:32 PM  Other: 03/16/2016 1:32 PM    Scribe for Treatment Team: Rondall Allegraandace L Al Bracewell, LCSWA 03/16/2016 1:32 PM

## 2016-03-16 NOTE — Progress Notes (Signed)
Adult Psychoeducational Group Note  Date:  03/16/2016 Time:  8:36 PM  Group Topic/Focus:  Wrap-Up Group:   The focus of this group is to help patients review their daily goal of treatment and discuss progress on daily workbooks.   Participation Level:  Active  Participation Quality:  Appropriate  Affect:  Appropriate  Cognitive:  Appropriate  Insight: Appropriate  Engagement in Group:  Engaged  Modes of Intervention:  Discussion  Additional Comments: The patient expressed that she attended groups.The patient also said that she rates today a 10. Octavio Mannshigpen, Braidon Chermak Lee 03/16/2016, 8:36 PM

## 2016-03-16 NOTE — Progress Notes (Signed)
Recreation Therapy Notes  Date: 03/16/16 Time: 1000 Location: [address] Dayroom  Group Topic: Coping Skills  Goal Area(s) Addresses:  Patient will be able to identify positive coping skills. Patient will be able to identify benefits of coping skills. Patient will be able to identify benefits of using coping skills post d/c.  Intervention: Can with various coping skills in it, dry erase maker, dry erase board  Activity: Coping Skills Pictionary.  LRT had [name] can with various types of coping skills.  [name] patient would come to the board, pick [name] slip of paper from the can and draw whatever is on the paper on the board.  The remaining patients would attempt to guess what the patient is drawing.  The person that guesses correctly would go next.  Education: Coping Skills, Discharge Planning.   Education Outcome: Acknowledges understanding/In group clarification offered/Needs additional education.   Clinical Observations/Feedback: Pt did not attend group.   Neysa Arts, LRT/CTRS         Claire [name] 03/16/2016 1:10 PM 
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
## 2016-03-17 LAB — COMPREHENSIVE METABOLIC PANEL
ALT: 14 U/L (ref 14–54)
AST: 22 U/L (ref 15–41)
Albumin: 4.1 g/dL (ref 3.5–5.0)
Alkaline Phosphatase: 87 U/L (ref 38–126)
Anion gap: 8 (ref 5–15)
BILIRUBIN TOTAL: 0.9 mg/dL (ref 0.3–1.2)
BUN: 9 mg/dL (ref 6–20)
CHLORIDE: 103 mmol/L (ref 101–111)
CO2: 26 mmol/L (ref 22–32)
CREATININE: 0.52 mg/dL (ref 0.44–1.00)
Calcium: 9.1 mg/dL (ref 8.9–10.3)
Glucose, Bld: 119 mg/dL — ABNORMAL HIGH (ref 65–99)
Potassium: 4.3 mmol/L (ref 3.5–5.1)
Sodium: 137 mmol/L (ref 135–145)
TOTAL PROTEIN: 6.8 g/dL (ref 6.5–8.1)

## 2016-03-17 LAB — URINALYSIS, COMPLETE (UACMP) WITH MICROSCOPIC
BACTERIA UA: NONE SEEN
BILIRUBIN URINE: NEGATIVE
Glucose, UA: NEGATIVE mg/dL
HGB URINE DIPSTICK: NEGATIVE
KETONES UR: NEGATIVE mg/dL
LEUKOCYTES UA: NEGATIVE
NITRITE: NEGATIVE
Protein, ur: NEGATIVE mg/dL
Specific Gravity, Urine: 1.006 (ref 1.005–1.030)
pH: 7 (ref 5.0–8.0)

## 2016-03-17 LAB — VITAMIN B12: VITAMIN B 12: 194 pg/mL (ref 180–914)

## 2016-03-17 LAB — RPR: RPR: NONREACTIVE

## 2016-03-17 LAB — FOLATE: FOLATE: 36.5 ng/mL (ref >5.9)

## 2016-03-17 MED ORDER — LOPERAMIDE HCL 2 MG PO CAPS
4.0000 mg | ORAL_CAPSULE | Freq: Once | ORAL | Status: AC
  Administered 2016-03-17: 4 mg via ORAL
  Filled 2016-03-17: qty 2

## 2016-03-17 MED ORDER — LOPERAMIDE HCL 2 MG PO CAPS
ORAL_CAPSULE | ORAL | Status: AC
  Administered 2016-03-17: 4 mg via ORAL
  Filled 2016-03-17: qty 2

## 2016-03-17 MED ORDER — NICOTINE POLACRILEX 2 MG MT GUM: 2.0000 mg | CHEWING_GUM | OROMUCOSAL | 0 refills | Status: AC | PRN

## 2016-03-17 MED ORDER — TRAZODONE HCL 50 MG PO TABS: 50.0000 mg | ORAL_TABLET | Freq: Every evening | ORAL | 0 refills | Status: AC | PRN

## 2016-03-17 MED ORDER — HYDROXYZINE HCL 25 MG PO TABS: 25.0000 mg | ORAL_TABLET | Freq: Four times a day (QID) | ORAL | 0 refills | Status: AC | PRN

## 2016-03-17 NOTE — [facility] Suicide Risk Assessment (Signed)
[facility]   Principal Problem: Delusional disorder, persecutory type (HCC) ( resolved )  Discharge Diagnoses:  Patient Active Problem List   Diagnosis Date Noted  . Delusional disorder, persecutory type (HCC) [F22] 03/16/2016    Total Time spent with patient: 30 minutes  Musculoskeletal: Strength & Muscle Tone: within normal limits Gait & Station: normal Patient leans: N/A  Psychiatric Specialty Exam: Review of Systems  Psychiatric/Behavioral: Negative for depression and suicidal ideas.  All other systems reviewed and are negative.   Blood pressure (!) 97/53, pulse 77, temperature 97.8 F (36.6 C), temperature source Oral, resp. rate 16, height 5\' 2"  (1.575 m), weight 50.3 kg (111 lb).Body mass index is 20.3 kg/m.  General Appearance: Casual  Eye Contact::  Fair  Speech:  Clear and Coherent409  Volume:  Normal  Mood:  Euthymic  Affect:  Congruent  Thought Process:  Goal Directed and Descriptions of Associations: Intact  Orientation:  Full (Time, Place, and Person)  Thought Content:  Logical  Suicidal Thoughts:  No  Homicidal Thoughts:  No  Memory:  Immediate;   Fair Recent;   Fair Remote;   Fair  Judgement:  Fair  Insight:  Fair  Psychomotor Activity:  Normal  Concentration:  Fair  Recall:  [employer]  Fund of Knowledge:Fair  Language: Fair  Akathisia:  No  Handed:  Right  AIMS (if indicated):     Assets:  Desire for Improvement  Sleep:  Number of Hours: 5.25  Cognition: WNL  ADL's:  Intact   Mental Status Per Nursing Assessment::   On Admission:  NA  Demographic Factors:  Caucasian  Loss Factors: NA  Historical Factors: Impulsivity  Risk Reduction Factors:   Positive therapeutic relationship  Continued Clinical Symptoms:  none  Cognitive Features That Contribute To Risk:  None    Suicide Risk:  Minimal: No identifiable suicidal ideation.  Patients presenting with no risk factors but with morbid ruminations; may be  classified as minimal risk based on the severity of the depressive symptoms    Plan Of Care/Follow-up recommendations:  Activity:  no restrictions Other:  none  Claire Fenter, MD 03/17/2016, 8:53 AM

## 2016-03-17 NOTE — BHH Group Notes (Signed)
BHH Group Notes:  (Nursing/MHT/Case Management/Adjunct)  Date:  03/17/2016  Time:  10:17 AM  Type of Therapy:  Nurse Education  Participation Level:  active  Participation Quality: engaged  Affect:  animated  Cognitive:  Appropriate  Insight:  Limited  Engagement in Group:  active  Modes of Intervention:  Discussion  Summary of Progress/Problems: Patient goals were getting released and going home and taking care of her family. Identified her "church tree" as a coping skill that works well for her - communing with nature.  Vinetta BergamoBarbara M Lamonica Trueba 03/17/2016, 10:17 AM

## 2016-03-17 NOTE — Progress Notes (Signed)
Patient to discharged ambulatory and stable to lobby. All discharge paperwork given and signed, valuables returned. Prescriptions given. Patient able to verbalize understanding. Patient stable, denies SI/HI/AVH. Patient given opportunity to express concerns and ask questions.  

## 2016-03-17 NOTE — Progress Notes (Signed)
  [facility] :  Will you be returning to the same living situation after discharge:  Yes,  alone At discharge, do you have transportation home?: Yes,  cousin Do you have the ability to pay for your medications: No.  Despite having SSDI, states she has no insurance  Release of information consent forms completed and in the chart;  Patient's signature needed at discharge.  Patient to Follow up at: Follow-up Information    Aftercare plans are refused by patient Follow up.           Next level of care provider has [facility] to [facility] Link:no  Safety Planning and Suicide Prevention discussed: No.  Have you used any form of tobacco in the last 30 days? (Cigarettes, Smokeless Tobacco, Cigars, and/or Pipes): Yes  Has patient been referred to the Quitline?: Patient refused referral  Patient has been referred for addiction treatment: Pt. refused referral  Lynnell ChadMareida J Grossman-Orr 03/17/2016, 11:58 AM

## 2016-03-17 NOTE — BHH Group Notes (Signed)
B[facility] Group Notes: (Clinical Social Work)   03/17/2016      Type of Therapy:  Group Therapy   Participation Level:  Did Not Attend despite MHT prompting   Ambrose MantleMareida Grossman-Orr, LCSW 03/17/2016, 12:29 PM
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
## 2016-03-20 ENCOUNTER — Telehealth: Payer: Self-pay | Admitting: Psychiatry

## 2016-03-20 LAB — VITAMIN D 25 HYDROXY (VIT D DEFICIENCY, FRACTURES): Vit D, 25-Hydroxy: 5.9 ng/mL — ABNORMAL LOW (ref 30.0–100.0)

## 2016-03-20 NOTE — Telephone Encounter (Signed)
Called patient about her labs - discussed that vitamin d level is low and she needs to follow up with her out patient provider.  Claire [name] ,MD Attending Psychiatrist  [facility]

## 2017-05-14 IMAGING — CT CT HEAD W/O CM
3 series · 16 of 47 positions shown, 19 images · non-contrast
Comparison: 07/09/2007.

CLINICAL DATA: Altered mental status.

EXAM:
CT HEAD WITHOUT CONTRAST
TECHNIQUE: Contiguous axial images were obtained from the base of the skull
through the vertex without intravenous contrast.

[Series 2: head wo · axial · 0.41mm/px · z∈[+957,+1082]mm · 10 of 31 slices shown, 13 images]
[im 3/31  brain]
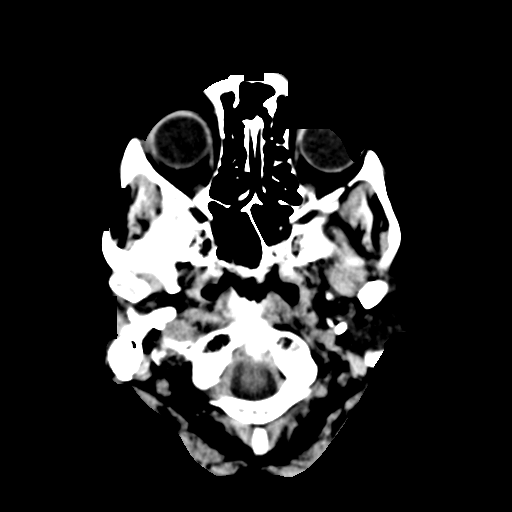
[im 3/31  bone]
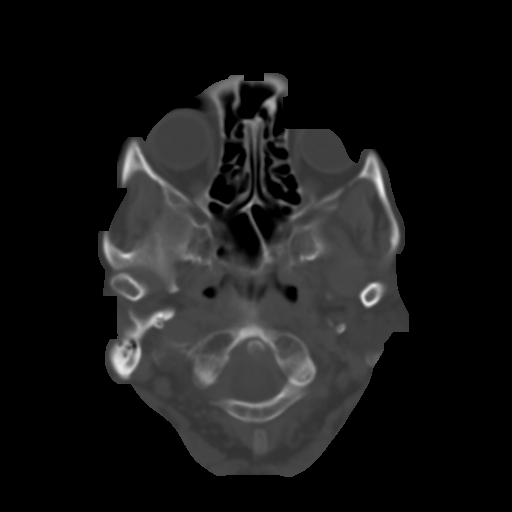
[im 6/31  brain]
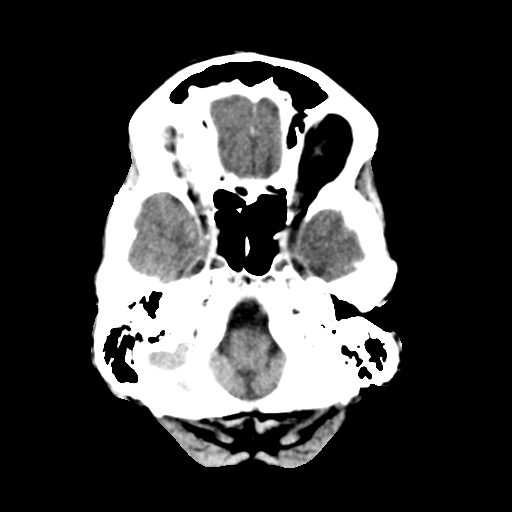
[im 9/31  brain]
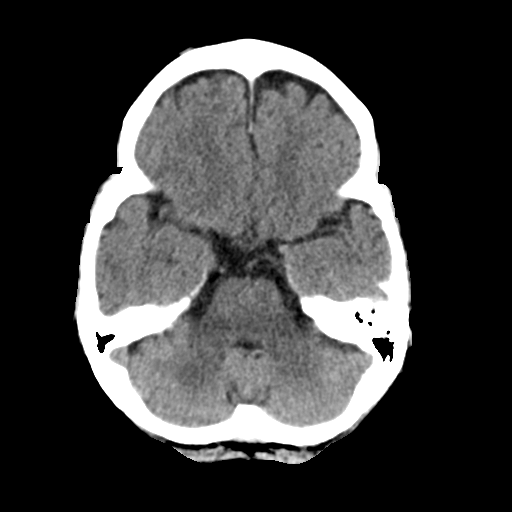
[im 11/31  brain]
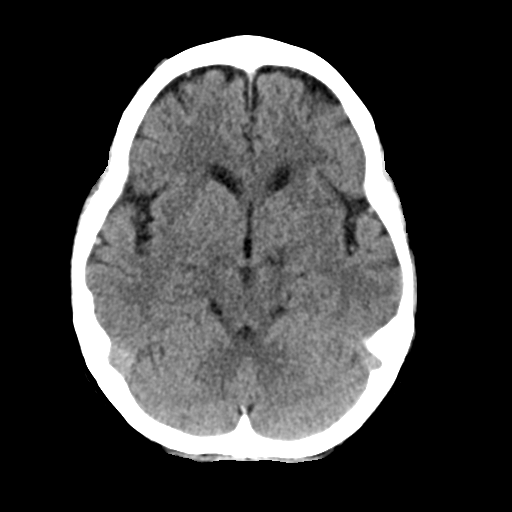
[im 14/31  brain]
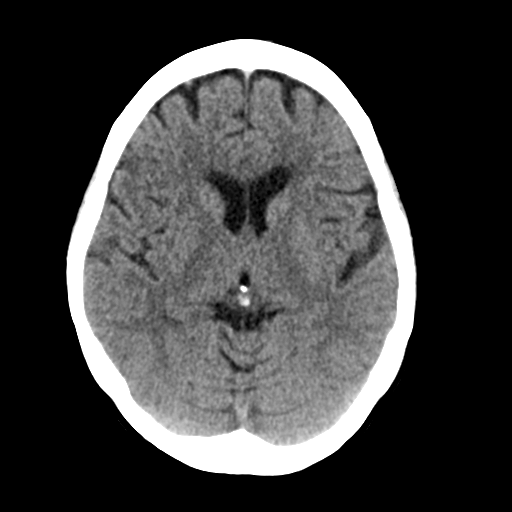
[im 14/31  bone]
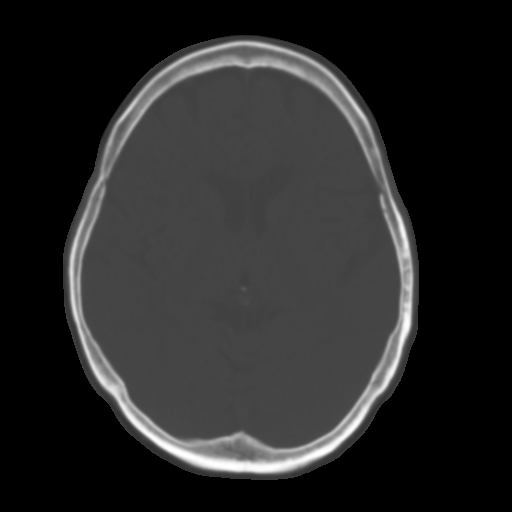
[im 17/31  brain]
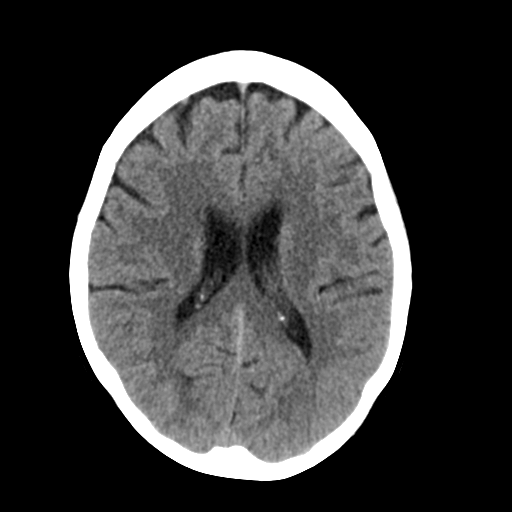
[im 20/31  brain]
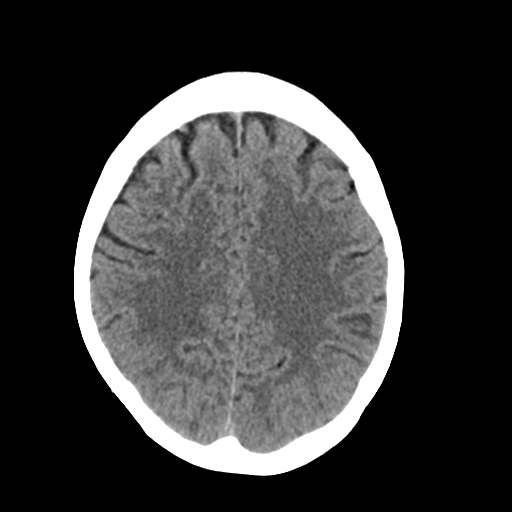
[im 23/31  brain]
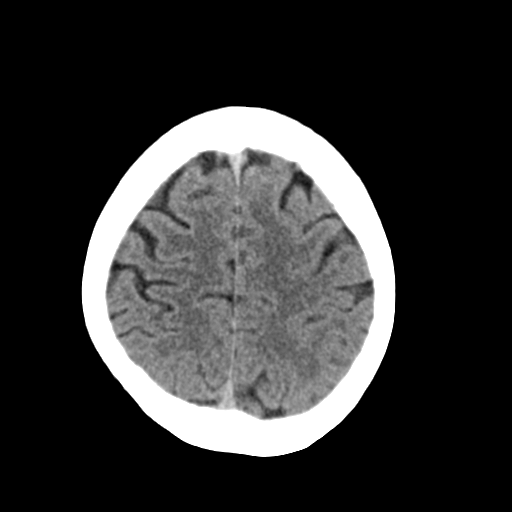
[im 25/31  brain]
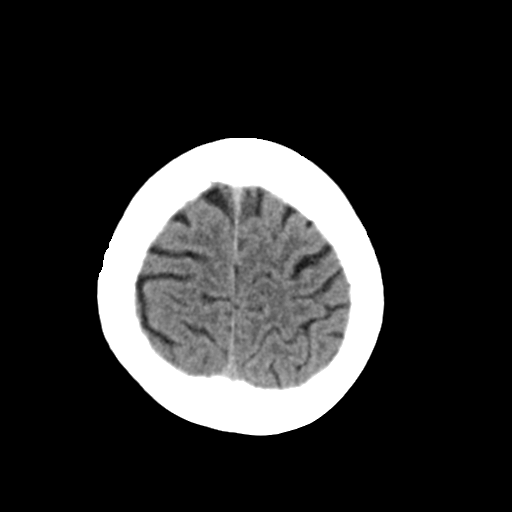
[im 25/31  bone]
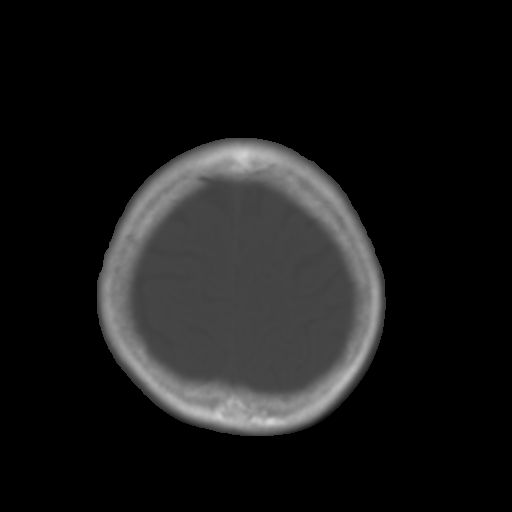
[im 28/31  brain]
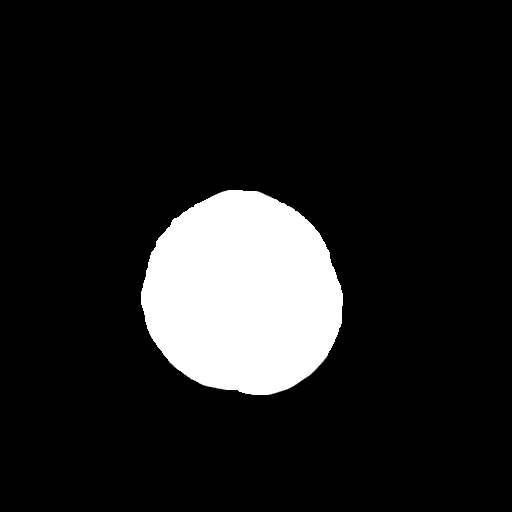

[Series 4: coronal soft tissue · coronal · 0.30mm/px · 3 of 67 slices shown]
[im 23/67  brain]
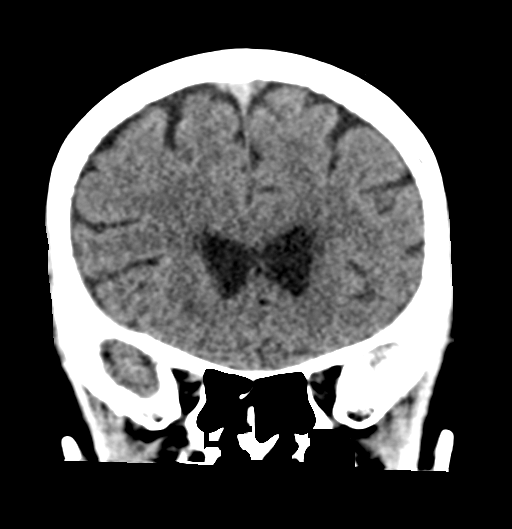
[im 30/67  brain]
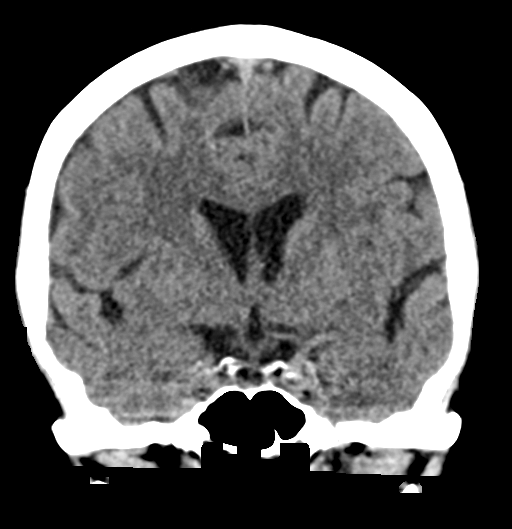
[im 37/67  brain]
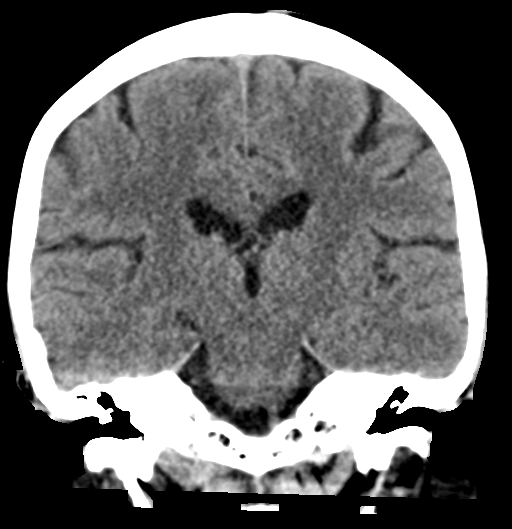

[Series 5: sagittal soft tissue · sagittal · 0.30mm/px · 3 of 53 slices shown]
[im 18/53  brain]
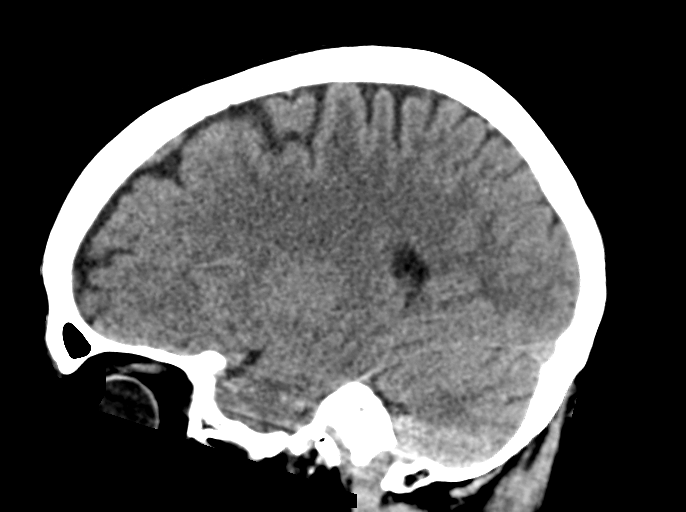
[im 27/53  brain]
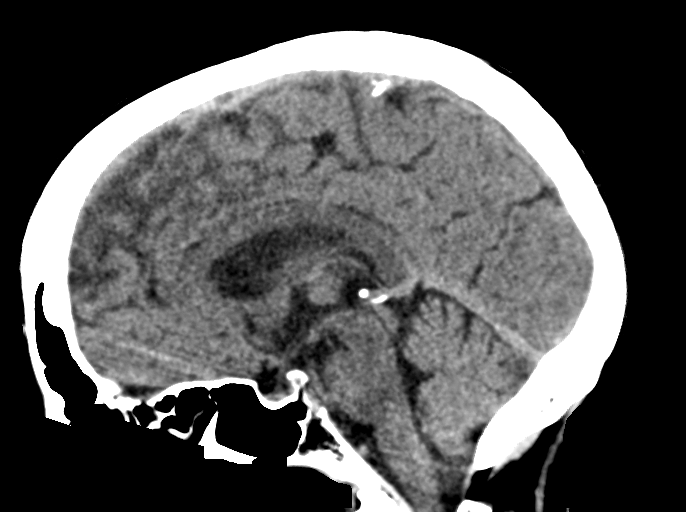
[im 35/53  brain]
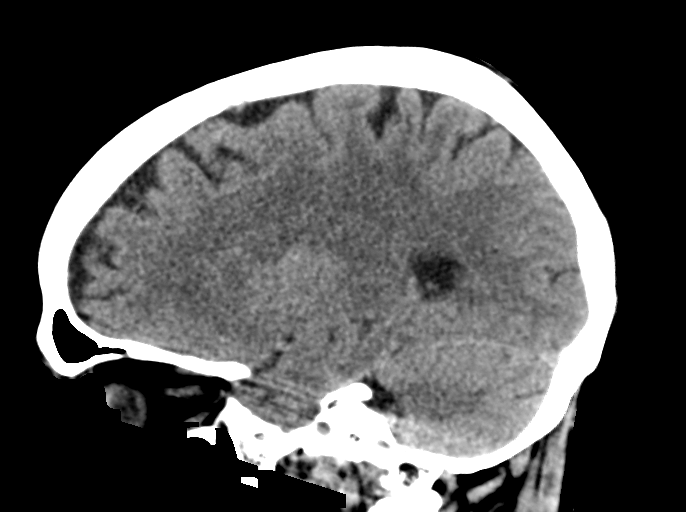

[16 of 47 positions shown; findings below may reference images not displayed]

FINDINGS: Brain: There is no evidence for acute hemorrhage, hydrocephalus,
mass lesion, or abnormal extra-axial fluid collection. No definite
CT evidence for acute infarction.

Vascular: Choose 1

Skull: No evidence for fracture. No worrisome lytic or sclerotic
lesion.

Sinuses/Orbits: The visualized paranasal sinuses and mastoid air
cells are clear. Visualized portions of the globes and intraorbital
fat are unremarkable.

Other: None.
IMPRESSION: Normal CT evaluation of the brain.
